# Patient Record
Sex: Female | Born: 1995 | Race: White | Hispanic: No | Marital: Single
Health system: Southern US, Community
[De-identification: ages and names within clinical notes are randomized; demographics above are authoritative.]

---

## 2020-08-05 ENCOUNTER — Other Ambulatory Visit: Payer: Self-pay

## 2020-08-05 ENCOUNTER — Emergency Department (HOSPITAL_COMMUNITY)
Admission: EM | Admit: 2020-08-05 | Discharge: 2020-08-05 | Disposition: A | Discharge status: Home / Self Care | Payer: BC Managed Care – PPO | Attending: Emergency Medicine | Admitting: Emergency Medicine

## 2020-08-05 ENCOUNTER — Emergency Department (HOSPITAL_COMMUNITY): Payer: BC Managed Care – PPO

## 2020-08-05 DIAGNOSIS — R0789 Other chest pain: Secondary | ICD-10-CM

## 2020-08-05 DIAGNOSIS — J029 Acute pharyngitis, unspecified: Secondary | ICD-10-CM | POA: Diagnosis present

## 2020-08-05 DIAGNOSIS — U071 COVID-19: Secondary | ICD-10-CM | POA: Diagnosis not present

## 2020-08-05 MED ORDER — NAPROXEN 500 MG PO TABS
500.0000 mg | ORAL_TABLET | Freq: Once | ORAL | Status: AC
  Administered 2020-08-05: 500 mg via ORAL
  Filled 2020-08-05: qty 1

## 2020-08-05 MED ORDER — BENZONATATE 100 MG PO CAPS
100.0000 mg | ORAL_CAPSULE | Freq: Once | ORAL | Status: AC
  Administered 2020-08-05: 100 mg via ORAL
  Filled 2020-08-05: qty 1

## 2020-08-05 MED ORDER — ACETAMINOPHEN 325 MG PO TABS
650.0000 mg | ORAL_TABLET | Freq: Once | ORAL | Status: AC | PRN
  Administered 2020-08-05: 650 mg via ORAL
  Filled 2020-08-05: qty 2

## 2020-08-05 MED ORDER — NAPROXEN 500 MG PO TABS: 500.0000 mg | ORAL_TABLET | Freq: Two times a day (BID) | ORAL | 0 refills | Status: AC

## 2020-08-05 MED ORDER — BENZONATATE 100 MG PO CAPS: 100.0000 mg | ORAL_CAPSULE | Freq: Three times a day (TID) | ORAL | 0 refills | Status: AC

## 2020-08-05 MED ORDER — ONDANSETRON 4 MG PO TBDP: 4.0000 mg | ORAL_TABLET | Freq: Three times a day (TID) | ORAL | 0 refills | Status: AC | PRN

## 2020-08-05 NOTE — Discharge Instructions (Signed)
As discussed, your EKG and chest x-ray were negative for any acute abnormalities.  I suspect your chest pain is related to muscular etiology due to COVID infection. Continue to self quarantine away from other per your employer's guidelines.  I am sending you home with cough medication, nausea medication, and naproxen.  Take as needed for your symptoms.  Follow-up with PCP if symptoms not improved within the next week.  Return to the ER for new or worsening symptoms.

## 2020-08-05 NOTE — ED Notes (Signed)
Pt ambulated in room unassisted with a steady gait. Pt O2 reading 97%-100% RA.

## 2020-08-05 NOTE — ED Triage Notes (Signed)
Pt reports COVID exposure, reports sore throat, chest pain, ShOB, bodyaches, fever, headache starting Monday.  Took advil 2 regular advil today at 0900. Chest pain worse with coughing.

## 2020-08-05 NOTE — ED Provider Notes (Signed)
[facility] DEPT Provider Note   CSN: [id number] Arrival date & time: 08/05/20  1343     History Chief Complaint  Patient presents with  . Covid Exposure  . Shortness of Breath  . Chest Pain    Kelly Johnson is a 25 y.o. female with no significant past medical history presents to the ED due to sore throat, chest pain, shortness of breath, intermittent fever, and body aches x4 days.  Patient denies sick contacts and known Covid exposure.  She has received both her Covid vaccines.  Patient states chest pain is a pressure-like sensation which is worse with coughing and movement.  Denies history of blood clots, recent surgeries, recent long immobilizations, and estrogen hormonal treatments.  Denies lower extremity edema.  She has been taking Advil with moderate relief.  Patient is a Furniture conservator/restorer and while she was waiting in the waiting room she received a phone call that she tested positive for Covid  History obtained from patient and past medical records. No interpreter used during encounter.      No past medical history on file.  There are no problems to display for this patient.  OB History   No obstetric history on file.     No family history on file.     Home Medications Prior to Admission medications   Medication Sig Start Date End Date Taking? Authorizing Provider  benzonatate (TESSALON) 100 MG capsule Take 1 capsule (100 mg total) by mouth every 8 (eight) hours. 08/05/20  Yes Kym Fenter C, PA-C  naproxen (NAPROSYN) 500 MG tablet Take 1 tablet (500 mg total) by mouth 2 (two) times daily. 08/05/20  Yes Yvonna Brun C, PA-C  ondansetron (ZOFRAN ODT) 4 MG disintegrating tablet Take 1 tablet (4 mg total) by mouth every 8 (eight) hours as needed for nausea or vomiting. 08/05/20  Yes Suzy Bouchard, PA-C    Allergies    Patient has no known allergies.  Review of Systems   Review of Systems  Constitutional: Positive for chills  and fever.  HENT: Positive for sore throat. Negative for trouble swallowing.   Respiratory: Positive for cough and shortness of breath.   Cardiovascular: Positive for chest pain. Negative for leg swelling.  Gastrointestinal: Negative for abdominal pain, diarrhea, nausea and vomiting.  Neurological: Positive for headaches.  All other systems reviewed and are negative.   Physical Exam Updated Vital Signs BP (!) 147/92   Pulse 76   Temp 99.3 F (37.4 C) (Oral)   Resp 20   Ht 5\' 5"  (1.651 m)   Wt 108.7 kg   SpO2 97%   BMI 39.87 kg/m   Physical Exam Vitals and nursing note reviewed.  Constitutional:      General: She is not in acute distress.    Appearance: She is not ill-appearing.  HENT:     Head: Normocephalic.  Eyes:     Pupils: Pupils are equal, round, and reactive to light.  Neck:     Comments: No meningismus. Cardiovascular:     Rate and Rhythm: Normal rate and regular rhythm.     Pulses: Normal pulses.     Heart sounds: Normal heart sounds. No murmur heard. No friction rub. No gallop.   Pulmonary:     Effort: Pulmonary effort is normal.     Breath sounds: Normal breath sounds.     Comments: Respirations equal and unlabored, patient able to speak in full sentences, lungs clear to auscultation bilaterally Chest:  Comments: Reproducible anterior chest wall tenderness.  No crepitus or deformity. Abdominal:     General: Abdomen is flat. There is no distension.     Palpations: Abdomen is soft.     Tenderness: There is no abdominal tenderness. There is no guarding or rebound.  Musculoskeletal:     Cervical back: Neck supple.     Comments: No lower extremity edema. Negative homan sign bilaterally.   Skin:    General: Skin is warm and dry.  Neurological:     General: No focal deficit present.     Mental Status: She is alert.  Psychiatric:        Mood and Affect: Mood normal.        Behavior: Behavior normal.     ED Results / Procedures / Treatments    Labs (all labs ordered are listed, but only abnormal results are displayed) Labs Reviewed - No data to display  EKG None  Radiology DG Chest Portable 1 View  Result Date: 08/05/2020 CLINICAL DATA:  COVID positive with chest pain and shortness of breath. EXAM: PORTABLE CHEST 1 VIEW COMPARISON:  None. FINDINGS: The heart size and mediastinal contours are within normal limits. Both lungs are clear. The visualized skeletal structures are unremarkable. IMPRESSION: No active disease. Electronically Signed   By: Aram Candela M.D.   On: 08/05/2020 18:27    Procedures Procedures (including critical care time)  Medications Ordered in ED Medications  acetaminophen (TYLENOL) tablet 650 mg (650 mg Oral Given 08/05/20 1812)  naproxen (NAPROSYN) tablet 500 mg (500 mg Oral Given 08/05/20 1812)  benzonatate (TESSALON) capsule 100 mg (100 mg Oral Given 08/05/20 1812)    ED Course  I have reviewed the triage vital signs and the nursing notes.  Pertinent labs & imaging results that were available during my care of the patient were reviewed by me and considered in my medical decision making (see chart for details).    MDM Rules/Calculators/A&P                         25 year old female presents to the ED due to Covid-like symptoms x4 days.  While patient was waiting in the waiting room, patient received a phone call that she tested positive for Covid.  Patient also admits to central chest pressure worse with coughing and movement.  Upon arrival, patient is afebrile, not tachycardic or hypoxic.  Patient in no acute distress and nontoxic-appearing.  Physical exam reassuring.  Lungs clear to auscultation bilaterally.  No lower extremity edema.  Negative Homans' sign bilaterally.  PERC negative and low risk using Wells criteria.  Low suspicion for PE/DVT.  No meningismus to suggest meningitis.  Given patient's chest pressure, chest x-ray and EKG ordered to rule out cardiac etiology.  Chest pain appears  atypical in nature and is reproducible on exam.  Low suspicion for ACS.  Suspect symptoms related to MSK etiology in the setting of Covid infection.  Naproxen and cough medication given for symptomatic relief.  Patient denies concern for pregnancy.  She currently has Mirena IUD.  Patient ambulated in the ED and maintained O2 saturation above 95%.  EKG personally reviewed which demonstrates normal sinus rhythm with no signs of acute ischemia.  Low suspicion for ACS.  Chest x-ray personally reviewed which is negative for signs of pneumonia, pneumothorax Bartow mediastinum.  Suspect symptoms related to Covid infection.  Quarantine guidelines discussed with patient.  Patient discharged with symptomatic treatment. Strict ED precautions discussed with  patient. Patient states understanding and agrees to plan. Patient discharged home in no acute distress and stable vitals  Kelly Johnson was evaluated in Emergency Department on 08/05/2020 for the symptoms described in the history of present illness. She was evaluated in the context of the global COVID-19 pandemic, which necessitated consideration that the patient might be at risk for infection with the SARS-CoV-2 virus that causes COVID-19. Institutional protocols and algorithms that pertain to the evaluation of patients at risk for COVID-19 are in a state of rapid change based on information released by regulatory bodies including the CDC and federal and state organizations. These policies and algorithms were followed during the patient's care in the ED.  Final Clinical Impression(s) / ED Diagnoses Final diagnoses:  COVID-19 virus infection  Chest wall pain    Rx / DC Orders ED Discharge Orders         Ordered    benzonatate (TESSALON) 100 MG capsule  Every 8 hours        08/05/20 1819    ondansetron (ZOFRAN ODT) 4 MG disintegrating tablet  Every 8 hours PRN        08/05/20 1819    naproxen (NAPROSYN) 500 MG tablet  2 times daily        08/05/20 1819            Jesusita Oka 08/05/20 Kermit Balo, MD 08/09/20 2340

## 2020-08-05 NOTE — ED Notes (Signed)
Pt stated she received a call from the COVID test she had done yesterday per [facility]) and its COVID+

## 2022-06-28 IMAGING — DX DG CHEST 1V PORT
1 series · 1 of 1 positions shown · non-contrast
Comparison: None.

CLINICAL DATA: COVID positive with chest pain and shortness of
breath.

EXAM:
PORTABLE CHEST 1 VIEW

[chest ap]
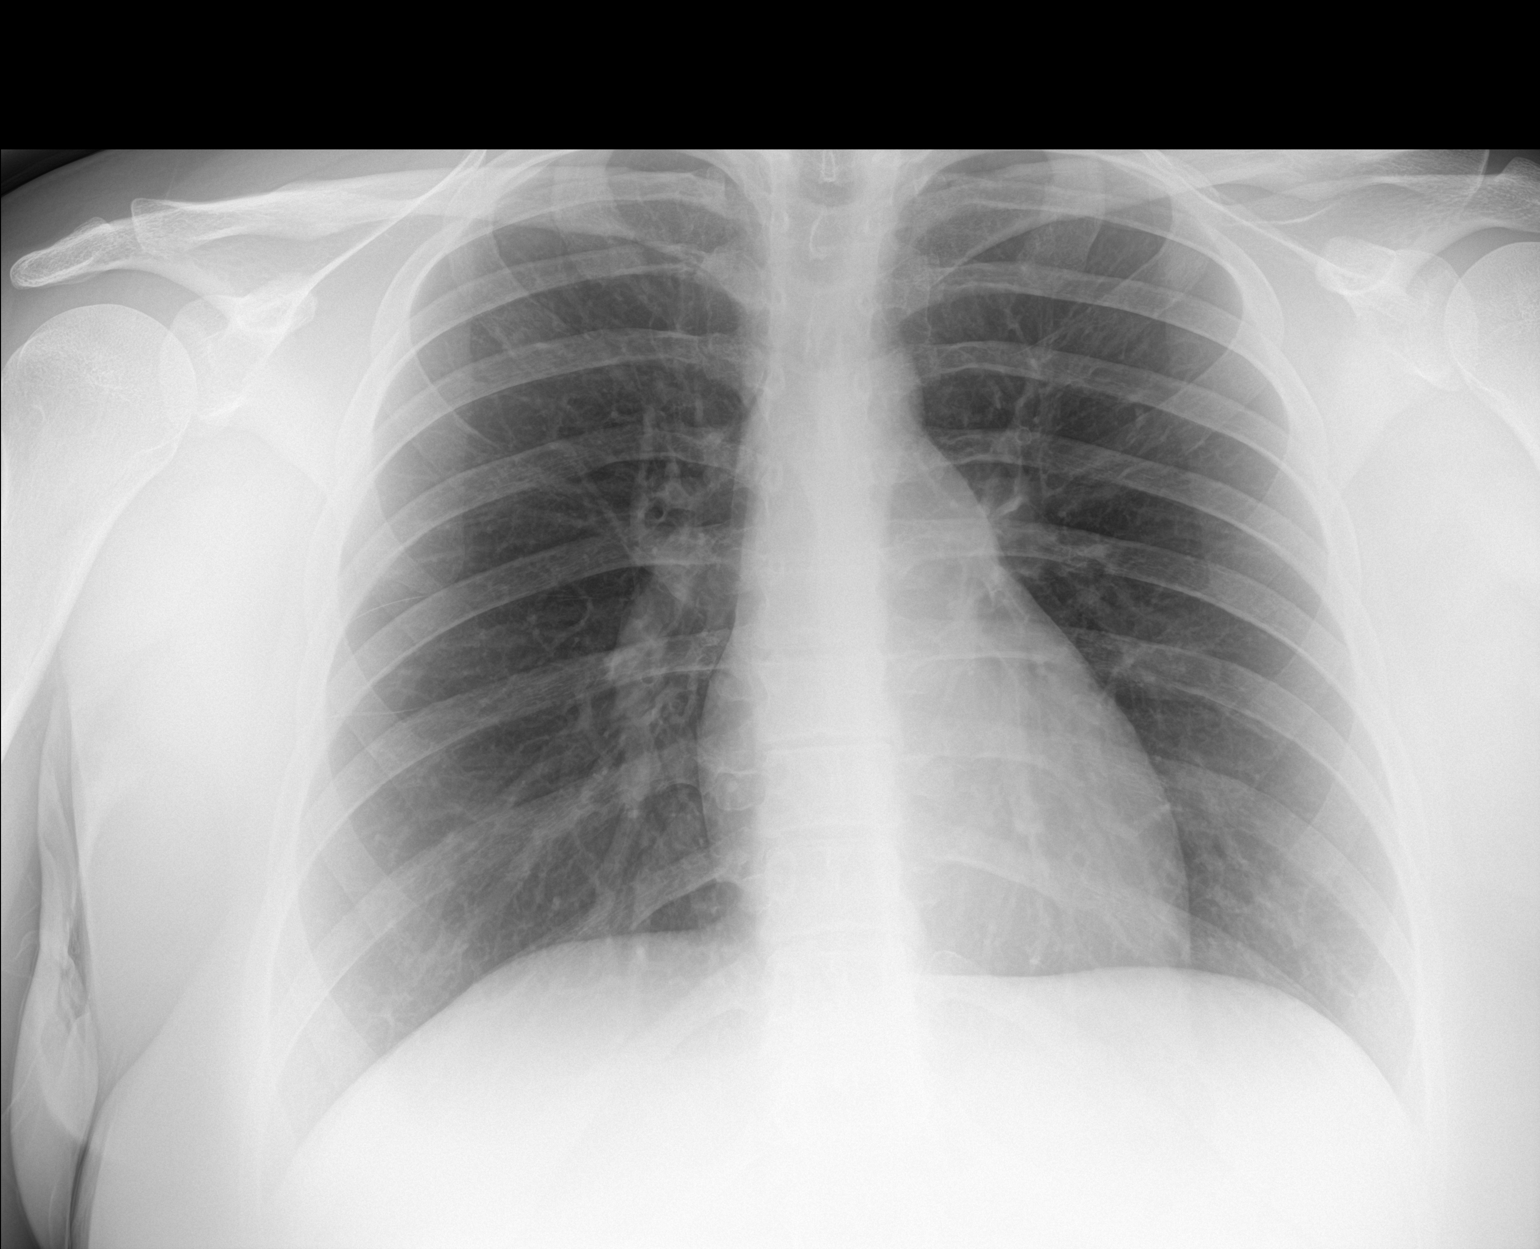

[1 of 1 positions shown; findings below may reference images not displayed]

FINDINGS: The heart size and mediastinal contours are within normal limits.
Both lungs are clear. The visualized skeletal structures are
unremarkable.
IMPRESSION: No active disease.
